# Patient Record
Sex: Male | Born: 1990 | Hispanic: Yes | Marital: Married | Smoking: Never smoker
Health system: Southern US, Community
[De-identification: ages and names within clinical notes are randomized; demographics above are authoritative.]

## PROBLEM LIST (undated history)

## (undated) HISTORY — PX: APPENDECTOMY: SHX54

---

## 2017-03-30 ENCOUNTER — Emergency Department (HOSPITAL_COMMUNITY)
Admission: EM | Admit: 2017-03-30 | Discharge: 2017-03-31 | Disposition: A | Discharge status: Home / Self Care | Payer: No Typology Code available for payment source | Attending: Emergency Medicine | Admitting: Emergency Medicine

## 2017-03-30 ENCOUNTER — Emergency Department (HOSPITAL_COMMUNITY): Payer: No Typology Code available for payment source

## 2017-03-30 ENCOUNTER — Encounter (HOSPITAL_COMMUNITY): Payer: Self-pay | Admitting: *Deleted

## 2017-03-30 DIAGNOSIS — Y999 Unspecified external cause status: Secondary | ICD-10-CM | POA: Insufficient documentation

## 2017-03-30 DIAGNOSIS — R1011 Right upper quadrant pain: Secondary | ICD-10-CM | POA: Diagnosis not present

## 2017-03-30 DIAGNOSIS — S39012A Strain of muscle, fascia and tendon of lower back, initial encounter: Secondary | ICD-10-CM

## 2017-03-30 DIAGNOSIS — S161XXA Strain of muscle, fascia and tendon at neck level, initial encounter: Secondary | ICD-10-CM

## 2017-03-30 DIAGNOSIS — S20219A Contusion of unspecified front wall of thorax, initial encounter: Secondary | ICD-10-CM | POA: Insufficient documentation

## 2017-03-30 DIAGNOSIS — Y939 Activity, unspecified: Secondary | ICD-10-CM | POA: Insufficient documentation

## 2017-03-30 DIAGNOSIS — Y92410 Unspecified street and highway as the place of occurrence of the external cause: Secondary | ICD-10-CM | POA: Diagnosis not present

## 2017-03-30 DIAGNOSIS — S199XXA Unspecified injury of neck, initial encounter: Secondary | ICD-10-CM | POA: Diagnosis present

## 2017-03-30 MED ORDER — ONDANSETRON HCL 4 MG/2ML IJ SOLN
4.0000 mg | Freq: Once | INTRAMUSCULAR | Status: AC
  Administered 2017-03-30: 4 mg via INTRAVENOUS
  Filled 2017-03-30: qty 2

## 2017-03-30 MED ORDER — MORPHINE SULFATE (PF) 4 MG/ML IV SOLN
4.0000 mg | Freq: Once | INTRAVENOUS | Status: AC
  Administered 2017-03-30: 4 mg via INTRAVENOUS
  Filled 2017-03-30: qty 1

## 2017-03-30 MED ORDER — IOPAMIDOL (ISOVUE-300) INJECTION 61%
100.0000 mL | Freq: Once | INTRAVENOUS | Status: AC | PRN
  Administered 2017-03-30: 100 mL via INTRAVENOUS

## 2017-03-30 MED ORDER — DIAZEPAM 5 MG PO TABS
10.0000 mg | ORAL_TABLET | Freq: Once | ORAL | Status: AC
  Administered 2017-03-30: 10 mg via ORAL
  Filled 2017-03-30: qty 2
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
## 2017-03-31 MED ORDER — IBUPROFEN 800 MG PO TABS: 800.0000 mg | ORAL_TABLET | Freq: Three times a day (TID) | ORAL | 0 refills | Status: AC

## 2017-03-31 MED ORDER — CYCLOBENZAPRINE HCL 10 MG PO TABS: 10.0000 mg | ORAL_TABLET | Freq: Three times a day (TID) | ORAL | 0 refills | Status: AC
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
